# Patient Record
Sex: Female | Born: 1950 | Race: White | Marital: Single | Smoking: Never smoker
Health system: Southern US, Community
[De-identification: ages and names within clinical notes are randomized; demographics above are authoritative.]

## PROBLEM LIST (undated history)

## (undated) DIAGNOSIS — I4891 Unspecified atrial fibrillation: Secondary | ICD-10-CM

## (undated) HISTORY — PX: PACEMAKER IMPLANT: EP1218

## (undated) HISTORY — DX: Unspecified atrial fibrillation: I48.91

---

## 2011-05-01 ENCOUNTER — Other Ambulatory Visit: Payer: Self-pay | Admitting: Neurosurgery

## 2011-05-01 DIAGNOSIS — M542 Cervicalgia: Secondary | ICD-10-CM

## 2011-05-04 ENCOUNTER — Ambulatory Visit
Admission: RE | Admit: 2011-05-04 | Discharge: 2011-05-04 | Disposition: A | Discharge status: Home / Self Care | Payer: BC Managed Care – PPO | Source: Ambulatory Visit | Attending: Neurosurgery | Admitting: Neurosurgery

## 2011-05-04 ENCOUNTER — Ambulatory Visit
Admission: RE | Admit: 2011-05-04 | Discharge: 2011-05-04 | Disposition: A | Discharge status: Home / Self Care | Payer: No Typology Code available for payment source | Source: Ambulatory Visit | Attending: Neurosurgery | Admitting: Neurosurgery

## 2011-05-04 DIAGNOSIS — M542 Cervicalgia: Secondary | ICD-10-CM

## 2011-05-04 MED ORDER — DIAZEPAM 2 MG PO TABS
10.0000 mg | ORAL_TABLET | Freq: Once | ORAL | Status: AC
  Administered 2011-05-04: 10 mg via ORAL

## 2011-05-04 MED ORDER — IOHEXOL 300 MG/ML  SOLN
10.0000 mL | Freq: Once | INTRAMUSCULAR | Status: AC | PRN
  Administered 2011-05-04: 10 mL via INTRATHECAL

## 2011-06-12 ENCOUNTER — Ambulatory Visit
Admission: RE | Admit: 2011-06-12 | Discharge: 2011-06-12 | Disposition: A | Discharge status: Home / Self Care | Payer: BC Managed Care – PPO | Source: Ambulatory Visit | Attending: Neurosurgery | Admitting: Neurosurgery

## 2011-06-12 ENCOUNTER — Other Ambulatory Visit: Payer: Self-pay | Admitting: Neurosurgery

## 2011-06-12 DIAGNOSIS — M5020 Other cervical disc displacement, unspecified cervical region: Secondary | ICD-10-CM

## 2012-04-04 IMAGING — CR DG CERVICAL SPINE 1V
2 series · 2 of 2 positions shown · non-contrast
Comparison: Cervical CT myelogram 05/04/2011.

CLINICAL DATA: Cervical fusion.

CERVICAL SPINE - 1 VIEW

[view not recorded (1 of 2)]
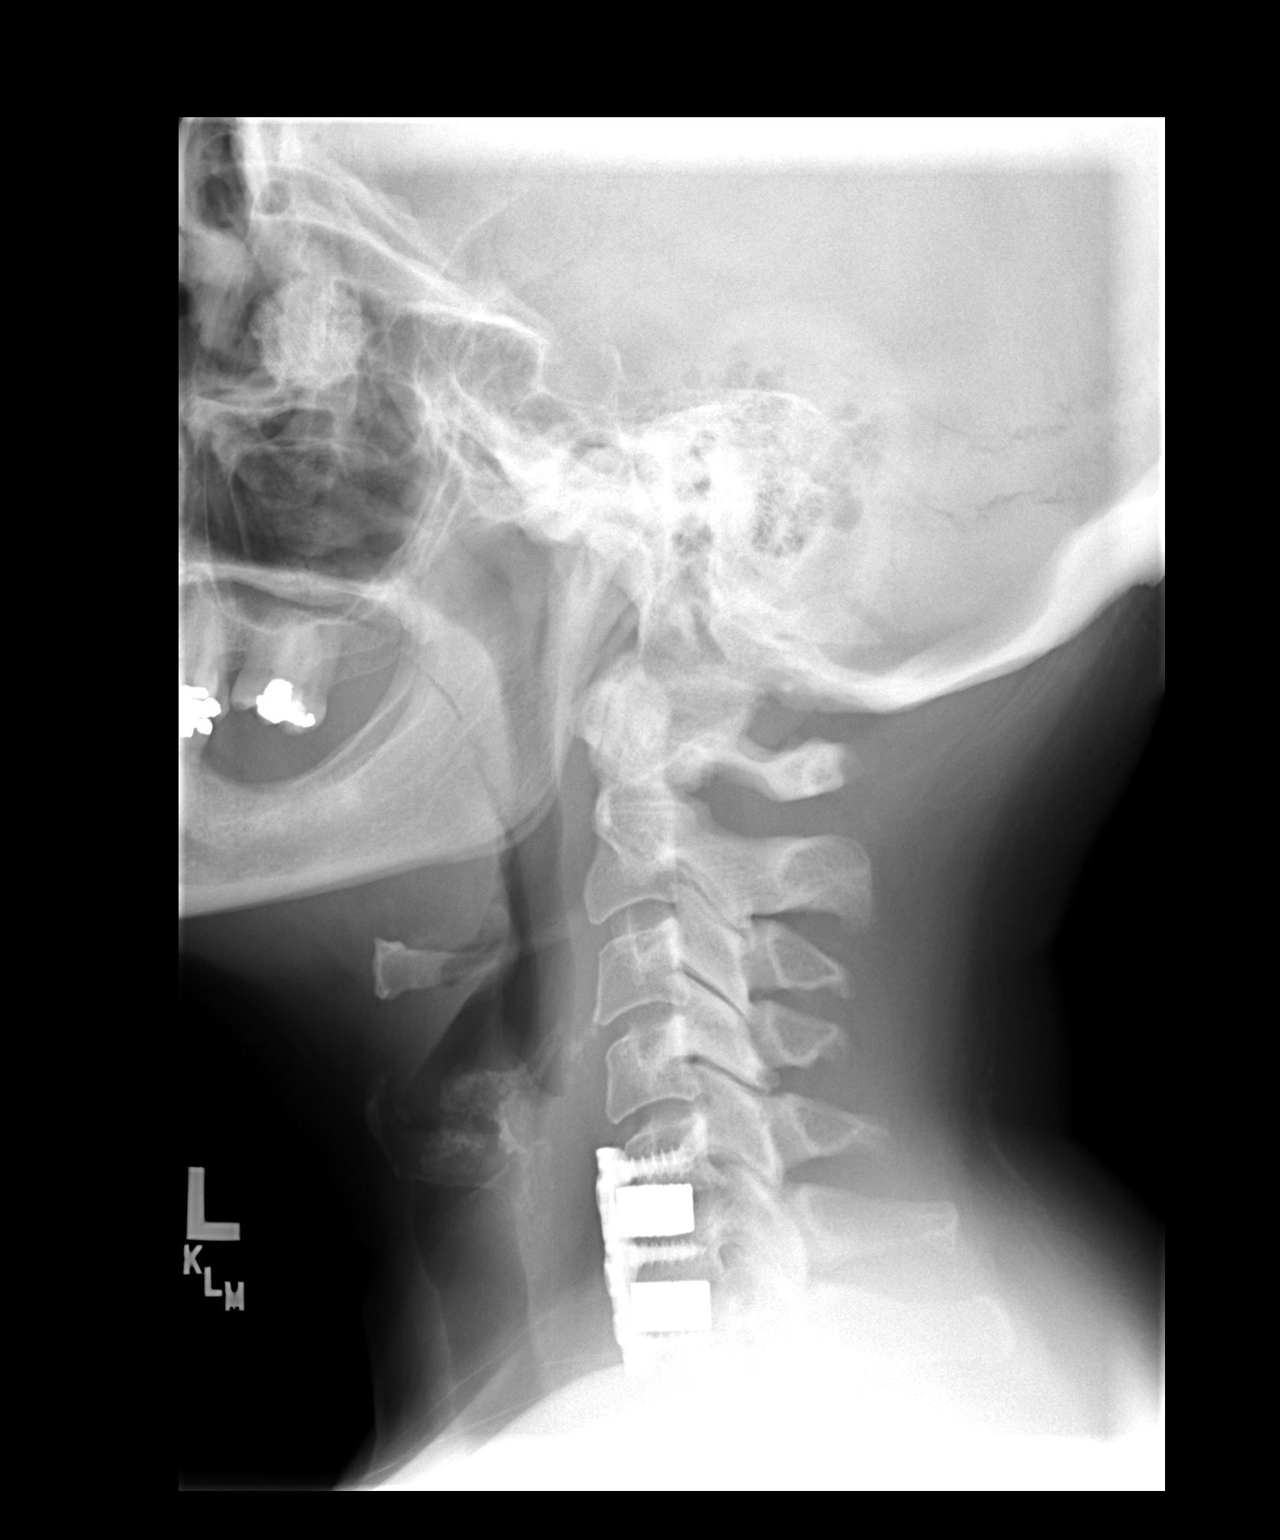

[view not recorded (2 of 2)]
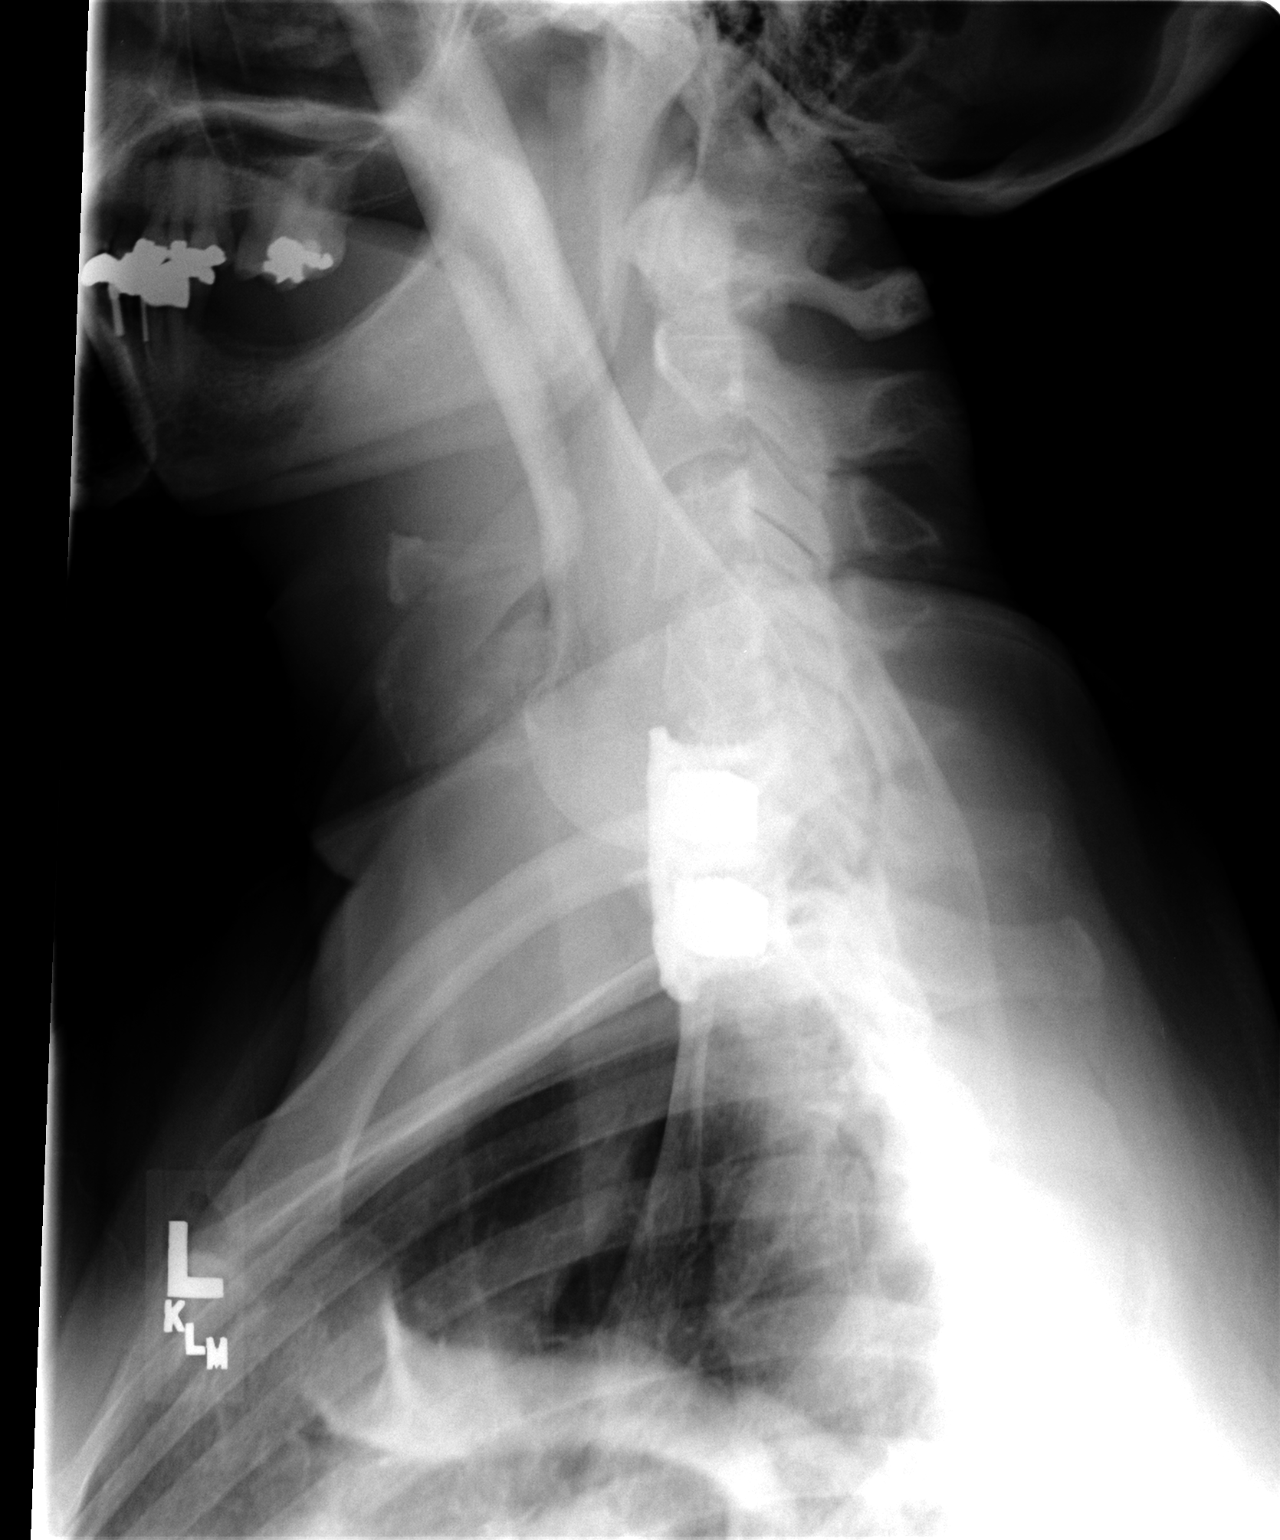

[2 of 2 positions shown; findings below may reference images not displayed]

FINDINGS: Anterior plate and screws are noted at C5-6 and C6-7 with
interbody fusion devices.  Normal alignment.  No complicating
features.
IMPRESSION: Cervical fusion changes at C5-6 and C6-7.

## 2022-07-13 ENCOUNTER — Other Ambulatory Visit: Payer: Self-pay

## 2022-07-13 ENCOUNTER — Encounter (HOSPITAL_COMMUNITY): Payer: Self-pay

## 2022-07-13 ENCOUNTER — Emergency Department (HOSPITAL_COMMUNITY)
Admission: EM | Admit: 2022-07-13 | Discharge: 2022-07-13 | Disposition: A | Discharge status: Home / Self Care | Payer: Medicare Other | Attending: Student | Admitting: Student

## 2022-07-13 DIAGNOSIS — Z950 Presence of cardiac pacemaker: Secondary | ICD-10-CM | POA: Insufficient documentation

## 2022-07-13 DIAGNOSIS — S01451A Open bite of right cheek and temporomandibular area, initial encounter: Secondary | ICD-10-CM | POA: Insufficient documentation

## 2022-07-13 DIAGNOSIS — Z23 Encounter for immunization: Secondary | ICD-10-CM | POA: Diagnosis not present

## 2022-07-13 DIAGNOSIS — W540XXA Bitten by dog, initial encounter: Secondary | ICD-10-CM | POA: Diagnosis not present

## 2022-07-13 DIAGNOSIS — Y990 Civilian activity done for income or pay: Secondary | ICD-10-CM | POA: Diagnosis not present

## 2022-07-13 DIAGNOSIS — S0185XA Open bite of other part of head, initial encounter: Secondary | ICD-10-CM

## 2022-07-13 DIAGNOSIS — S0993XA Unspecified injury of face, initial encounter: Secondary | ICD-10-CM | POA: Diagnosis present

## 2022-07-13 MED ORDER — AMOXICILLIN-POT CLAVULANATE 875-125 MG PO TABS: 1.0000 | ORAL_TABLET | Freq: Two times a day (BID) | ORAL | 0 refills | Status: AC

## 2022-07-13 MED ORDER — TETANUS-DIPHTH-ACELL PERTUSSIS 5-2.5-18.5 LF-MCG/0.5 IM SUSY
0.5000 mL | PREFILLED_SYRINGE | Freq: Once | INTRAMUSCULAR | Status: AC
  Administered 2022-07-13: 0.5 mL via INTRAMUSCULAR
  Filled 2022-07-13: qty 0.5

## 2022-07-13 NOTE — ED Provider Notes (Signed)
[facility] EMERGENCY DEPARTMENT Provider Note  CSN: [id number] Arrival date & time: 07/13/22 1029  Chief Complaint(s) Animal Bite  HPI Brandi Arnold is a 71 y.o. female who presents emergency department for evaluation of a dog bite to the face.  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  Dog is up-to-date on vaccines.  Patient last tetanus in 2013.  Denies additional systemic or traumatic complaints.   Past Medical History Past Medical History:  Diagnosis Date   Atrial fibrillation (HCC)    There are no problems to display for this patient.  Home Medication(s) Prior to Admission medications   Medication Sig Start Date End Date Taking? Authorizing Provider  amoxicillin-clavulanate (AUGMENTIN) 875-125 MG tablet Take 1 tablet by mouth every 12 (twelve) hours. 07/13/22  Yes Miel Wisener, MD                                                                                                                                    Past Surgical History Past Surgical History:  Procedure Laterality Date   PACEMAKER IMPLANT     Family History No family history on file.  Social History Social History   Tobacco Use   Smoking status: Never   Smokeless tobacco: Never  Substance Use Topics   Alcohol use: Yes    Comment: rarely   Drug use: Never   Allergies Cephalexin  Review of Systems Review of Systems  Skin:  Positive for wound.    Physical Exam Vital Signs  I have reviewed the triage vital signs BP 129/66 (BP Location: Right Arm)   Pulse 66   Temp 98.5 F (36.9 C) (Oral)   Resp 15   Ht 5\' 5"  (1.651 m)   Wt 99 kg   SpO2 99%   BMI 36.32 kg/m   Physical Exam Vitals and nursing note reviewed.  Constitutional:      General: She is not in acute distress.    Appearance: She is well-developed.  HENT:     Head: Normocephalic and atraumatic.  Eyes:     Conjunctiva/sclera: Conjunctivae normal.  Cardiovascular:     Rate and Rhythm: Normal rate and  regular rhythm.     Heart sounds: No murmur heard. Pulmonary:     Effort: Pulmonary effort is normal. No respiratory distress.     Breath sounds: Normal breath sounds.  Abdominal:     Palpations: Abdomen is soft.     Tenderness: There is no abdominal tenderness.  Musculoskeletal:        General: No swelling.     Cervical back: Neck supple.  Skin:    General: Skin is warm and dry.     Capillary Refill: Capillary refill takes less than 2 seconds.     Findings: Lesion present.  Neurological:     Mental Status: She is alert.  Psychiatric:        Mood and Affect:  Mood normal.     ED Results and Treatments Labs (all labs ordered are listed, but only abnormal results are displayed) Labs Reviewed - No data to display                                                                                                                        Radiology No results found.  Pertinent labs & imaging results that were available during my care of the patient were reviewed by me and considered in my medical decision making (see MDM for details).  Medications Ordered in ED Medications  Tdap (BOOSTRIX) injection 0.5 mL (0.5 mLs Intramuscular Given 07/13/22 1244)                                                                                                                                     Procedures .Marland KitchenLaceration Repair  Date/Time: 07/13/2022 1:11 PM  Performed by: Teressa Lower, MD Authorized by: Teressa Lower, MD   Laceration details:    Location:  Face   Face location:  R cheek   Length (cm):  1 Pre-procedure details:    Preparation:  Patient was prepped and draped in usual sterile fashion Treatment:    Area cleansed with:  Saline   Amount of cleaning:  Standard   Irrigation method:  Pressure wash Skin repair:    Repair method:  Steri-Strips Approximation:    Approximation:  Close Repair type:    Repair type:  Simple Post-procedure details:    Dressing:  Open (no  dressing)   (including critical care time)  Medical Decision Making / ED Course   This patient presents to the ED for concern of dog bite, facial laceration, this involves an extensive number of treatment options, and is a complaint that carries with it a high risk of complications and morbidity.  The differential diagnosis includes laceration, fracture, retained foreign body  MDM: Patient seen in the emergency room for evaluation of a dog bite to the face.  Physical exam with multiple abrasions in the pattern of the dog's mouth with one 1 cm small laceration to the right cheek nearest to the lip.  Laceration repaired with Steri-Strips.  Tetanus updated and prescription for Augmentin sent.  Patient then discharged with outpatient follow-up.   Additional history obtained:  -External records from outside source obtained and reviewed including: Chart review including previous notes, labs, imaging, consultation notes  Medicines ordered and prescription drug management: Meds ordered this encounter  Medications   amoxicillin-clavulanate (AUGMENTIN) 875-125 MG tablet    Sig: Take 1 tablet by mouth every 12 (twelve) hours.    Dispense:  14 tablet    Refill:  0   Tdap (BOOSTRIX) injection 0.5 mL    -I have reviewed the patients home medicines and have made adjustments as needed  Critical interventions none  Social Determinants of Health:  Factors impacting patients care include: none   Reevaluation: After the interventions noted above, I reevaluated the patient and found that they have :improved  Co morbidities that complicate the patient evaluation  Past Medical History:  Diagnosis Date   Atrial fibrillation (HCC)       Dispostion: I considered admission for this patient, but patient does not meet inpatient criteria for admission and she is safe for discharge outpatient follow-up     Final Clinical Impression(s) / ED Diagnoses Final diagnoses:  Dog bite of face,  initial encounter     @PCDICTATION @    , MD 07/13/22 1313

## 2022-07-13 NOTE — ED Notes (Signed)
Animal control called and informed of dog bite.

## 2022-07-13 NOTE — ED Triage Notes (Signed)
Pt presents to ED with dog bite to right side of face. Pt works at [employer] and was bit there. Happened at [address] [place].
